# Patient Record
Sex: Female | Born: 1960 | Race: White | Hispanic: No | Smoking: Never smoker
Health system: Southern US, Community
[De-identification: ages and names within clinical notes are randomized; demographics above are authoritative.]

## PROBLEM LIST (undated history)

## (undated) DIAGNOSIS — G43909 Migraine, unspecified, not intractable, without status migrainosus: Secondary | ICD-10-CM

---

## 1997-12-05 ENCOUNTER — Other Ambulatory Visit: Admission: RE | Admit: 1997-12-05 | Discharge: 1997-12-05 | Payer: Self-pay | Admitting: Obstetrics and Gynecology

## 1998-08-06 ENCOUNTER — Ambulatory Visit: Admission: RE | Admit: 1998-08-06 | Discharge: 1998-08-06 | Payer: Self-pay | Admitting: Obstetrics and Gynecology

## 1998-09-25 ENCOUNTER — Other Ambulatory Visit: Admission: RE | Admit: 1998-09-25 | Discharge: 1998-09-25 | Payer: Self-pay | Admitting: Obstetrics and Gynecology

## 2000-03-21 ENCOUNTER — Other Ambulatory Visit: Admission: RE | Admit: 2000-03-21 | Discharge: 2000-03-21 | Payer: Self-pay | Admitting: Obstetrics and Gynecology

## 2001-03-23 ENCOUNTER — Other Ambulatory Visit: Admission: RE | Admit: 2001-03-23 | Discharge: 2001-03-23 | Payer: Self-pay | Admitting: Obstetrics and Gynecology

## 2002-05-14 ENCOUNTER — Other Ambulatory Visit: Admission: RE | Admit: 2002-05-14 | Discharge: 2002-05-14 | Payer: Self-pay | Admitting: Obstetrics and Gynecology

## 2002-05-15 ENCOUNTER — Other Ambulatory Visit: Admission: RE | Admit: 2002-05-15 | Discharge: 2002-05-15 | Payer: Self-pay | Admitting: Obstetrics and Gynecology

## 2003-06-10 ENCOUNTER — Other Ambulatory Visit: Admission: RE | Admit: 2003-06-10 | Discharge: 2003-06-10 | Payer: Self-pay | Admitting: Obstetrics and Gynecology

## 2004-10-01 ENCOUNTER — Other Ambulatory Visit: Admission: RE | Admit: 2004-10-01 | Discharge: 2004-10-01 | Payer: Self-pay | Admitting: Obstetrics and Gynecology

## 2008-04-29 ENCOUNTER — Emergency Department (HOSPITAL_BASED_OUTPATIENT_CLINIC_OR_DEPARTMENT_OTHER): Admission: EM | Admit: 2008-04-29 | Discharge: 2008-04-29 | Payer: Self-pay | Admitting: Emergency Medicine

## 2008-04-29 ENCOUNTER — Ambulatory Visit: Payer: Self-pay | Admitting: Diagnostic Radiology

## 2009-05-09 ENCOUNTER — Encounter: Admission: RE | Admit: 2009-05-09 | Discharge: 2009-05-09 | Payer: Self-pay | Admitting: Obstetrics and Gynecology

## 2009-05-13 ENCOUNTER — Encounter: Admission: RE | Admit: 2009-05-13 | Discharge: 2009-05-13 | Payer: Self-pay | Admitting: Obstetrics and Gynecology

## 2010-09-10 ENCOUNTER — Ambulatory Visit (HOSPITAL_COMMUNITY)
Admission: RE | Admit: 2010-09-10 | Discharge: 2010-09-10 | Disposition: A | Discharge status: Home / Self Care | Payer: PRIVATE HEALTH INSURANCE | Source: Ambulatory Visit | Attending: Obstetrics and Gynecology | Admitting: Obstetrics and Gynecology

## 2010-09-10 ENCOUNTER — Other Ambulatory Visit: Payer: Self-pay | Admitting: Obstetrics and Gynecology

## 2010-09-10 DIAGNOSIS — N840 Polyp of corpus uteri: Secondary | ICD-10-CM | POA: Insufficient documentation

## 2010-09-10 LAB — CBC
HCT: 38.1 % (ref 36.0–46.0)
MCH: 29.4 pg (ref 26.0–34.0)
MCHC: 33.9 g/dL (ref 30.0–36.0)
MCV: 86.8 fL (ref 78.0–100.0)
RBC: 4.39 MIL/uL (ref 3.87–5.11)
RDW: 12.5 % (ref 11.5–15.5)
WBC: 6.1 10*3/uL (ref 4.0–10.5)

## 2010-09-23 NOTE — Op Note (Signed)
  Ashley Johnson, Ashley NO.:  [id number]  MEDICAL RECORD NO.:  [id number]  LOCATION:  [facility]                          FACILITY:  [facility]  PHYSICIAN:  Malva Limes, M.D.    DATE OF BIRTH:  October 14, 1960  DATE OF PROCEDURE:  09/10/2010 DATE OF DISCHARGE:                              OPERATIVE REPORT   PREOPERATIVE DIAGNOSIS:  Endometrial polyp.  POSTOPERATIVE DIAGNOSIS:  Endometrial polyp.  PROCEDURE: 1. Hysteroscopy. 2. Dilation and curettage.  SURGEON:  Malva Limes, MD  ANESTHESIA:  General and local.  ANTIBIOTIC:  Ancef 1 gram.  DRAINS:  Red rubber catheter through the bladder.  COMPLICATIONS:  None.  SPECIMENS:  Endometrial curettings sent to pathology.  PROCEDURE:  The patient was taken to the operating room where general anesthetic was administered without difficulty.  She was then placed in dorsal lithotomy position.  She was prepped and draped in the usual fashion for this procedure.  Her bladder was drained with a red rubber catheter.  Sterile speculum placed in the vagina and 20 mL of 1% lidocaine was used for paracervical block.  A single-tooth tenaculum was applied to the anterior cervical lip.  The cervix was serially dilated to a 29-French.  The hysteroscope was advanced through the endocervical canal.  The patient had 2 endometrial polyps on the posterior aspect and one small polyp on the left anterior cornual area.  Both ostia were visualized.  The hysteroscope was removed.  Sharp curettage was then performed followed by repeat hysteroscopy.  On the repeat hysteroscopy, no evidence of polyps were found.  This concluded the procedure. The patient was taken to recovery room in stable condition.  Instrument and lap counts were correct x2.  The patient will be discharged home. She will be sent home with Vicodin to take p.r.n.  She will follow up in the office in 4 weeks.          ______________________________ Malva Limes,  M.D.     MA/MEDQ  D:  09/10/2010  T:  09/11/2010  Job:  [id number]  Electronically Signed by Malva Limes M.D. on 09/23/2010 08:49:20 PM

## 2011-10-14 ENCOUNTER — Other Ambulatory Visit: Payer: Self-pay | Admitting: Obstetrics and Gynecology

## 2012-06-27 ENCOUNTER — Other Ambulatory Visit: Payer: Self-pay | Admitting: Obstetrics and Gynecology

## 2013-07-02 ENCOUNTER — Other Ambulatory Visit: Payer: Self-pay | Admitting: Obstetrics and Gynecology

## 2013-07-22 ENCOUNTER — Emergency Department (HOSPITAL_COMMUNITY): Payer: PRIVATE HEALTH INSURANCE

## 2013-07-22 ENCOUNTER — Emergency Department (HOSPITAL_COMMUNITY)
Admission: EM | Admit: 2013-07-22 | Discharge: 2013-07-22 | Disposition: A | Discharge status: Home / Self Care | Payer: PRIVATE HEALTH INSURANCE | Attending: Emergency Medicine | Admitting: Emergency Medicine

## 2013-07-22 ENCOUNTER — Encounter (HOSPITAL_COMMUNITY): Payer: Self-pay | Admitting: Emergency Medicine

## 2013-07-22 DIAGNOSIS — R0781 Pleurodynia: Secondary | ICD-10-CM

## 2013-07-22 DIAGNOSIS — W19XXXA Unspecified fall, initial encounter: Secondary | ICD-10-CM

## 2013-07-22 DIAGNOSIS — G43909 Migraine, unspecified, not intractable, without status migrainosus: Secondary | ICD-10-CM | POA: Insufficient documentation

## 2013-07-22 DIAGNOSIS — W010XXA Fall on same level from slipping, tripping and stumbling without subsequent striking against object, initial encounter: Secondary | ICD-10-CM | POA: Insufficient documentation

## 2013-07-22 DIAGNOSIS — Y929 Unspecified place or not applicable: Secondary | ICD-10-CM | POA: Insufficient documentation

## 2013-07-22 DIAGNOSIS — S298XXA Other specified injuries of thorax, initial encounter: Secondary | ICD-10-CM | POA: Insufficient documentation

## 2013-07-22 DIAGNOSIS — Y9302 Activity, running: Secondary | ICD-10-CM | POA: Insufficient documentation

## 2013-07-22 HISTORY — DX: Migraine, unspecified, not intractable, without status migrainosus: G43.909

## 2013-07-22 NOTE — ED Provider Notes (Signed)
Medical screening examination/treatment/procedure(s) were performed by non-physician practitioner and as supervising physician I was immediately available for consultation/collaboration.   EKG Interpretation None        Dagmar HaitWilliam Garry Nicolini, MD 07/22/13 [phone]

## 2013-07-22 NOTE — ED Provider Notes (Signed)
CSN: [id number]     Arrival date & time 07/22/13  1959 History   None    This chart was scribed for non-physician practitioner, Coral CeoJessica London Tarnowski, PA-C working with Dagmar HaitWilliam Blair Walden, MD by Arlan OrganAshley Leger, ED Scribe. This patient was seen in room TR07C/TR07C and the patient's care was started at 9:07 PM.   Chief Complaint  Patient presents with  . Fall   The history is provided by the patient. No language interpreter was used.    HPI Comments: Ashley DoomKaren A Kinchen is a 53 y.o. female with a PMH of migraines who presents to the Emergency Department complaining of a fall that occurred 8 days ago. Pt states he was running and tripped over a dog landing on the R side of her back. No head trauma or LOC upon impact. She now c/o R sided rib pain described as "achy" and "sharp" that is progressively worsening. This pain is exacerbated with sneezing and movement. She has tried OTC Tylenol with mild temporary improvement. At this time she denies any fever, chills, SOB, chest pain, weakness, cough, difficulty breathing, or lightheadedness. Denies weakness, loss of bowel/bladder function or saddle anesthesia. She reports increased activity throughout the week and questions if her symptoms may be related to a pulled muscle. Pt currently prescribed Imitrex for intermittent Migraines. No recent travel or surgeries. No past history of blood clots. No estrogen supplementation. Otherwise she is healthy; no other pertinent past medical history. No other concerns this visit.   Past Medical History  Diagnosis Date  . Migraine    History reviewed. No pertinent past surgical history. No family history on file. History  Substance Use Topics  . Smoking status: Never Smoker   . Smokeless tobacco: Not on file  . Alcohol Use: No   OB History   Grav Para Term Preterm Abortions TAB SAB Ect Mult Living                 Review of Systems  Constitutional: Negative for fever and chills.  HENT: Negative for congestion.    Eyes: Negative for redness.  Respiratory: Negative for cough and shortness of breath.   Cardiovascular: Negative for chest pain.  Musculoskeletal: Negative for back pain.       R sided rib pain  Skin: Negative for rash.  Psychiatric/Behavioral: Negative for confusion.    Allergies  Review of patient's allergies indicates not on file.  Home Medications   Prior to Admission medications   Not on File   Triage Vitals: BP 121/75  Pulse 60  Temp(Src) 98.6 F (37 C) (Oral)  Resp 20  Ht 5\' 6"  (1.676 m)  Wt 140 lb (63.504 kg)  BMI 22.61 kg/m2  SpO2 98%   Filed Vitals:   07/22/13 2005 07/22/13 2125  BP: 121/75 113/74  Pulse: 60 71  Temp: 98.6 F (37 C)   TempSrc: Oral   Resp: 20 16  Height: 5\' 6"  (1.676 m)   Weight: 140 lb (63.504 kg)   SpO2: 98% 98%    Physical Exam  Nursing note and vitals reviewed. Constitutional: She is oriented to person, place, and time. She appears well-developed and well-nourished. No distress.  HENT:  Head: Normocephalic and atraumatic.  Right Ear: External ear normal.  Left Ear: External ear normal.  Mouth/Throat: Oropharynx is clear and moist.  Eyes: Conjunctivae are normal. Right eye exhibits no discharge. Left eye exhibits no discharge.  Neck: Normal range of motion. Neck supple.  No cervical spinal or paraspinal tenderness to  palpation throughout.  No limitations with neck ROM.    Cardiovascular: Normal rate, regular rhythm, normal heart sounds and intact distal pulses.  Exam reveals no gallop and no friction rub.   No murmur heard. Pulmonary/Chest: Effort normal and breath sounds normal. No respiratory distress. She has no wheezes. She has no rales. She exhibits tenderness.    Tenderness to palpation to the right inferior lateral ribs. No crepitus, ecchymosis, erythema, or lacerations. Pain worse with movement.   Abdominal: Soft. She exhibits no distension. There is no tenderness.  Musculoskeletal: Normal range of motion. She exhibits  no edema and no tenderness.  No tenderness to palpation to the thoracic or lumbar spinous processes throughout.  No tenderness to palpation to the paraspinal muscles throughout. Patient able to ambulate without difficulty or ataxia. Patient moving all extremities throughout exam. No LE edema or calf tenderness bilaterally  Neurological: She is alert and oriented to person, place, and time.  Skin: Skin is warm and dry. She is not diaphoretic.     ED Course  Procedures (including critical care time)  DIAGNOSTIC STUDIES: Oxygen Saturation is 98% on RA, Normal by my interpretation.    COORDINATION OF CARE: 9:15 PM- Will order DG ribs unilateral w/ chest R. Discussed treatment plan with pt at bedside and pt agreed to plan.     Labs Review Labs Reviewed - No data to display  Imaging Review Dg Ribs Unilateral W/chest Right  07/22/2013   CLINICAL DATA:  Right-sided rib pain following injury  EXAM: RIGHT RIBS AND CHEST - 3+ VIEW  COMPARISON:  None.  FINDINGS: No fracture or other bone lesions are seen involving the ribs. There is no evidence of pneumothorax or pleural effusion. Both lungs are clear. Heart size and mediastinal contours are within normal limits.  IMPRESSION: No acute abnormality noted.   Electronically Signed   By: Alcide CleverMark  Lukens M.D.   On: 07/22/2013 20:56     EKG Interpretation None      MDM   Ashley Johnson is a 53 y.o. female is a 53 y.o. female with a PMH of migraines who presents to the Emergency Department complaining of a fall that occurred 8 days ago. Etiology of pain likely due to a rib contusion. X-rays negative for fracture or malalignment. No warning signs or symptoms. No concern for cauda equina, epidural abscess, or other serious/life threatening cause of back pain. Lungs clear to auscultation. Doubt cardiopulmonary causes. Pain reproducible. No hypoxia, respiratory distress, or tachypnea. Encouraged patient to avoid strenuous activity and continue Advil. Return  precautions, discharge instructions, and follow-up was discussed with the patient before discharge.    New Prescriptions   No medications on file     Final impressions: 1. Fall   2. Rib pain on right side       Luiz IronJessica Katlin Demarkis Gheen PA-C   I personally performed the services described in this documentation, which was scribed in my presence. The recorded information has been reviewed and is accurate.    Jillyn LedgerJessica K Philip Kotlyar, PA-C 07/22/13 2128

## 2013-07-22 NOTE — ED Notes (Addendum)
Pt was tripped by a dog and fell on her back last Sunday.  C/o R sided rib pain since fall.  States she was also very active last Sunday and could have pulled a muscle.  Denies sob.  Pain worse today after sneezing.

## 2013-07-22 NOTE — Discharge Instructions (Signed)
Continue to take Advil for pain  Deep breathing  Return to the emergency department if you develop any changing/worsening condition, chest pain, difficulty breathing, coughing up blood, fever, or any other concerns (please read additional information regarding your condition below)      Chest Wall Pain Chest wall pain is pain in or around the bones and muscles of your chest. It may take up to 6 weeks to get better. It may take longer if you must stay physically active in your work and activities.  CAUSES  Chest wall pain may happen on its own. However, it may be caused by:  A viral illness like the flu.  Injury.  Coughing.  Exercise.  Arthritis.  Fibromyalgia.  Shingles. HOME CARE INSTRUCTIONS   Avoid overtiring physical activity. Try not to strain or perform activities that cause pain. This includes any activities using your chest or your abdominal and side muscles, especially if heavy weights are used.  Put ice on the sore area.  Put ice in a plastic bag.  Place a towel between your skin and the bag.  Leave the ice on for 15-20 minutes per hour while awake for the first 2 days.  Only take over-the-counter or prescription medicines for pain, discomfort, or fever as directed by your caregiver. SEEK IMMEDIATE MEDICAL CARE IF:   Your pain increases, or you are very uncomfortable.  You have a fever.  Your chest pain becomes worse.  You have new, unexplained symptoms.  You have nausea or vomiting.  You feel sweaty or lightheaded.  You have a cough with phlegm (sputum), or you cough up blood. MAKE SURE YOU:   Understand these instructions.  Will watch your condition.  Will get help right away if you are not doing well or get worse. Document Released: 03/01/2005 Document Revised: 05/24/2011 Document Reviewed: 10/26/2010 [facility] Patient Information 2014 [place], [place].   Rib Contusion A rib contusion (bruise) can occur by a blow to the chest or by a fall  against a hard object. Usually these will be much better in a couple weeks. If X-rays were taken today and there are no broken bones (fractures), the diagnosis of bruising is made. However, broken ribs may not show up for several days, or may be discovered later on a routine X-ray when signs of healing show up. If this happens to you, it does not mean that something was missed on the X-ray, but simply that it did not show up on the first X-rays. Earlier diagnosis will not usually change the treatment. HOME CARE INSTRUCTIONS   Avoid strenuous activity. Be careful during activities and avoid bumping the injured ribs. Activities that pull on the injured ribs and cause pain should be avoided, if possible.  For the first day or two, an ice pack used every 20 minutes while awake may be helpful. Put ice in a plastic bag and put a towel between the bag and the skin.  Eat a normal, well-balanced diet. Drink plenty of fluids to avoid constipation.  Take deep breaths several times a day to keep lungs free of infection. Try to cough several times a day. Splint the injured area with a pillow while coughing to ease pain. Coughing can help prevent pneumonia.  Wear a rib belt or binder only if told to do so by your caregiver. If you are wearing a rib belt or binder, you must do the breathing exercises as directed by your caregiver. If not used properly, rib belts or binders restrict breathing  which can lead to pneumonia.  Only take over-the-counter or prescription medicines for pain, discomfort, or fever as directed by your caregiver. SEEK MEDICAL CARE IF:   You or your child has an oral temperature above 102 F (38.9 C).  Your baby is older than 3 months with a rectal temperature of 100.5 F (38.1 C) or higher for more than 1 day.  You develop a cough, with thick or bloody sputum. SEEK IMMEDIATE MEDICAL CARE IF:   You have difficulty breathing.  You feel sick to your stomach (nausea), have vomiting or  belly (abdominal) pain.  You have worsening pain, not controlled with medications, or there is a change in the location of the pain.  You develop sweating or radiation of the pain into the arms, jaw or shoulders, or become light headed or faint.  You or your child has an oral temperature above 102 F (38.9 C), not controlled by medicine.  Your or your baby is older than 3 months with a rectal temperature of 102 F (38.9 C) or higher.  Your baby is 50 months old or younger with a rectal temperature of 100.4 F (38 C) or higher. MAKE SURE YOU:   Understand these instructions.  Will watch your condition.  Will get help right away if you are not doing well or get worse. Document Released: 11/24/2000 Document Revised: 06/26/2012 Document Reviewed: 10/18/2007 [facility] Patient Information 2014 [place], [place].   RICE: Routine Care for Injuries The routine care of many injuries includes Rest, Ice, Compression, and Elevation (RICE). HOME CARE INSTRUCTIONS Rest is needed to allow your body to heal. Routine activities can usually be resumed when comfortable. Injured tendons and bones can take up to 6 weeks to heal. Tendons are the cord-like structures that attach muscle to bone. Ice following an injury helps keep the swelling down and reduces pain. Put ice in a plastic bag. Place a towel between your skin and the bag. Leave the ice on for 15-20 minutes, 03-04 times a day. Do this while awake, for the first 24 to 48 hours. After that, continue as directed by your caregiver. Compression helps keep swelling down. It also gives support and helps with discomfort. If an elastic bandage has been applied, it should be removed and reapplied every 3 to 4 hours. It should not be applied tightly, but firmly enough to keep swelling down. Watch fingers or toes for swelling, bluish discoloration, coldness, numbness, or excessive pain. If any of these problems occur, remove the bandage and reapply loosely.  Contact your caregiver if these problems continue. Elevation helps reduce swelling and decreases pain. With extremities, such as the arms, hands, legs, and feet, the injured area should be placed near or above the level of the heart, if possible. SEEK IMMEDIATE MEDICAL CARE IF: You have persistent pain and swelling. You develop redness, numbness, or unexpected weakness. Your symptoms are getting worse rather than improving after several days. These symptoms may indicate that further evaluation or further X-rays are needed. Sometimes, X-rays may not show a small broken bone (fracture) until 1 week or 10 days later. Make a follow-up appointment with your caregiver. Ask when your X-ray results will be ready. Make sure you get your X-ray results. Document Released: 06/13/2000 Document Revised: 05/24/2011 Document Reviewed: 07/31/2010 [employer] Patient Information 2014 [place]. =   Emergency Department Resource Guide 1) Find a Doctor and Pay Out of Pocket Although you won't have to find out who is covered by your insurance plan, it is  a good idea to ask around and get recommendations. You will then need to call the office and see if the doctor you have chosen will accept you as a new patient and what types of options they offer for patients who are self-pay. Some doctors offer discounts or will set up payment plans for their patients who do not have insurance, but you will need to ask so you aren't surprised when you get to your appointment.  2) Contact Your Local Health Department Not all health departments have doctors that can see patients for sick visits, but many do, so it is worth a call to see if yours does. If you don't know where your local health department is, you can check in your phone book. The CDC also has a tool to help you locate your state's health department, and many state websites also have listings of all of their local health departments.  3) Find a Walk-in Clinic If your  illness is not likely to be very severe or complicated, you may want to try a walk in clinic. These are popping up all over the country in pharmacies, drugstores, and shopping centers. They're usually staffed by nurse practitioners or physician assistants that have been trained to treat common illnesses and complaints. They're usually fairly quick and inexpensive. However, if you have serious medical issues or chronic medical problems, these are probably not your best option.  No Primary Care Doctor: - Call Health Connect at  [phone] - they can help you locate a primary care doctor that  accepts your insurance, provides certain services, etc. - Physician Referral Service- [phone]  Chronic Pain Problems: Organization         Address  Phone   Notes  [facility]  [phone] Patients need to be referred by their primary care doctor.   Medication Assistance: Organization         Address  Phone   Notes  [facility] Medication So [facility] [address] [place], [place] [address] [phone] --Must be a resident of [facility] [place] -- Must have NO insurance coverage whatsoever (no Medicaid/ Medicare, etc.) -- The pt. MUST have a primary care doctor that directs their care regularly and follows them in the community   MedAssist  [phone]   [employer]  [phone]    Agencies that provide inexpensive medical care: Organization         Address  Phone   Notes  [facility]  [phone]   [facility]    [phone]   [facility] [address] [place], [place] [address] [phone]) [phone]   [facility] [address], [place] [phone]) [phone]   Planned Parenthood    [phone]   [facility]    [phone]   Community Health and [facility]  [address], [place] Phone:  [phone], Fax:  [phone] Hours of Operation:   9 am - 6 pm, M-F.  Also accepts Medicaid/Medicare and self-pay.  [facility]  [address], [place] Phone: [phone], Fax: [phone]) [phone]. Hours of Operation:  8:30 am - 5:30 pm, M-F.  Also accepts Medicaid and self-pay.  [facility] [place] [address], [place] Phone: [phone]) [phone]   [facility] [address], [place], [place] [phone], Ext. [phone] Mondays & Thursdays: 7-9 AM.  First 15 patients are seen on a first come, first serve basis.    Medicaid-accepting [facility] Providers:  Organization         Address  Phone   Notes  [facility] [address], [place] [phone] Also accepts self-pay patients.  [facility] [address] [place], [place]  [phone]   [facility] [address], [place] [phone]) [phone]   [facility] [address] [place] [address], [place] [phone]) [phone]   Renaye RakersVeita Bland [address], [place]   [phone]) [phone] Only accepts [place] Access [place] patients after they have their name applied to their card.   Self-Pay (no insurance) in [facility] [place]:  Organization         Address  Phone   Notes  Sickle Cell Patients, [facility] [address], [place] [phone]) [phone]   [facility] [address], [place] [phone]) [phone]   [facility] [place]  [address], [place] [phone]) [phone]   [facility]/Dr. Osei-Bonsu  [address] [place] [address], [place] or [phone], [place] [phone] Phone number for both [place] and [place] locations is the same.  [facility] [address], [place] [phone]) [phone]   [facility] [place] [address] [place] [address], [place] or [address] [phone]   [facility] [address], [place] [phone]) [phone], phone; [phone]) [phone], fax Sees patients 1st and 3rd Saturday of every month.  Must not qualify for public or private insurance (i.e. Medicaid, Medicare, [place] Health Choice, Veterans' Benefits)  Household income should be no more than 200% of the poverty level The clinic cannot treat you if you are pregnant or think you are pregnant  Sexually transmitted diseases are not treated at the clinic.    Dental Care: Organization         Address  Phone  Notes  [facility] of [facility] [address], [place] [phone] Accepts children up to age 51 who are enrolled in [place]icaid or [place] Health Choice; pregnant women with a Medicaid card; and children who have applied for Medicaid or [place] Health Choice, but were declined, whose parents can pay a reduced fee at time of service.  [facility] of [facility] [place]  [address], [place] [phone]) [phone] Accepts children up to age 51 who are enrolled in [place] or [place] Health Choice; pregnant women with a Medicaid card; and children who have applied for Medicaid or [place] Health Choice, but were declined, whose parents can pay a reduced fee at time of service.  [facility]  [address], [place] [phone] Patients are seen by appointment only. Walk-ins are not accepted. [facility] will see patients 51 years of age and older. Monday - Tuesday (8am-5pm) Most Wednesdays (8:30-5pm) $30 per visit, cash only  [facility]  [address], [facility] [phone] Patients are seen by appointment only. Walk-ins are not accepted. [facility] will see patients 51 years of age and older. One Wednesday Evening (Monthly: Volunteer Based).  $30 per visit, cash only  [facility]  [phone]) [phone] for adults; Children under age 814, call Graduate Pediatric  Dentistry at  [phone]. Children aged 11-14, please call [phone] to request a pediatric application.  Dental services are provided in all areas of dental care including fillings, crowns and bridges, complete and partial dentures, implants, gum treatment, root canals, and extractions. Preventive care is also provided. Treatment is provided to both adults and children. Patients are selected via a lottery and there is often a waiting list.   [facility] [address], [place]  [phone] [email or web address]   [facility] [address] [place], [place] [phone], Ext. [phone] Second and Fourth Thursday of each month, opens at 6:30 AM; Clinic ends at 9 AM.  Patients are seen on a first-come first-served basis, and a limited number are seen during each clinic.   [facility]  [address] [place], [place] [phone]   Eligibility Requirements You must have lived in [place], [place], or [place] counties for at least the last three months.   You cannot be eligible for state or federal sponsored [place], including [employer], [place], or [employer].   You generally cannot be eligible for healthcare insurance through your employer.    How to apply: Eligibility screenings are held every Tuesday and Wednesday afternoon from 1:00 pm until 4:00 pm. You do not need an appointment for the interview!  [facility] [address], [place], [place] [phone]   [facility] Health Department  [phone]   [facility] Health Department  [phone]   [facility] Health Department  [phone]    Behavioral Health Resources in the Community: Intensive Outpatient Programs Organization         Address  Phone  Notes  [facility] [address] [place], [place] [phone]   [facility] [address] [place], [place] [phone]   ADS: Alcohol &  Drug Svcs [address], [place], [place]  [phone]   [facility] Mental Health [address],  [place], [place] [phone] or [phone]   Substance Abuse Resources Organization         Address  Phone  Notes  Alcohol [facility] Drug Services  [phone]   [facility]  [phone]   [facility]  [phone]   Floydene Flock  [phone]   Residential & Outpatient Substance Abuse Program  [phone]   Psychological Services Organization         Address  Phone  Notes  [facility] Behavioral Health  [phone]   [facility] Services  [phone]   [facility] Mental Health [address] [place] [phone] or [phone]    Mobile Crisis Teams Organization         Address  Phone  Notes  [facility], Mobile Crisis Care Unit  [phone]   [facility]  [address] [place], [place] [phone]   Doristine Locks [address] [place] [phone]    Self-Help/Support Groups Organization         Address  Phone             Notes  [facility] [place] - variety of support groups  [phone]- I7437963 Call for more information  Narcotics Anonymous (NA), [facility]aring [facility] [address], [employer] [place]  2 meetings at this location   Statistician         Address  Phone  Notes  [facility] [address],  [place]  [phone]   [facility]  [address], [place] [address], [place], [place] [phone]   [facility] [address] [place], [place] [phone] Admissions: 8am-3pm M-F  [facility] [address]    [place], [place] [phone]   [facility] [address] [place], [place], [place] [phone]   [facility] [address]  [place], [place] [phone]   [facility] - Intensive Outpatient [address], [place], [place]  [phone]   [facility] ([facility] [address]  [place], [place] [phone] or [phone]   [facility] ([facility]) [address] [place], [place] [phone] Accepts Medicaid  [facility] [address],  [place] [phone] Substance Abuse/Addiction Treatment   [facility] Organization         Address  Phone  Notes  [facility]  [phone]   Angie Fava, PhD [address] [place] [place], [place]   [phone] or [phone]   [facility]al   [address] [place], [place] [phone]   [facility] [address] [place], [place] [phone] Insurance/Medicaid/sponsorship through [facility] and Families [address]                                    [place], [place] [phone] Therapy/tele-psych/case  [facility] [address], [place] [phone]    Dr. Lolly Mustache  [phone]   [facility] [place]  [employer] [facility] Dept. 1) [address] [place] 2) [address], [place] 3)  [address], [place] [phone]  [phone]   [facility] Child Abuse Hotline 3040263928 or [phone] (After Hours)

## 2013-07-22 NOTE — ED Notes (Signed)
Onset last Sunday pt tripped over dog and fell on back.  Pain was ok during week.  Pt did normal activities during the week: mowing, walking around natural science center, working; then, Friday night pain on right side of rib started hurting and progressively getting worse.  No difficulties breathing.

## 2014-07-08 ENCOUNTER — Other Ambulatory Visit: Payer: Self-pay | Admitting: Obstetrics and Gynecology

## 2014-07-09 LAB — CYTOLOGY - PAP

## 2014-08-22 ENCOUNTER — Other Ambulatory Visit: Payer: Self-pay | Admitting: Obstetrics and Gynecology

## 2014-08-22 DIAGNOSIS — R928 Other abnormal and inconclusive findings on diagnostic imaging of breast: Secondary | ICD-10-CM

## 2014-08-30 ENCOUNTER — Ambulatory Visit
Admission: RE | Admit: 2014-08-30 | Discharge: 2014-08-30 | Disposition: A | Discharge status: Home / Self Care | Payer: PRIVATE HEALTH INSURANCE | Source: Ambulatory Visit | Attending: Obstetrics and Gynecology | Admitting: Obstetrics and Gynecology

## 2014-08-30 DIAGNOSIS — R928 Other abnormal and inconclusive findings on diagnostic imaging of breast: Secondary | ICD-10-CM

## 2016-01-14 ENCOUNTER — Other Ambulatory Visit: Payer: Self-pay | Admitting: Obstetrics and Gynecology

## 2016-01-14 DIAGNOSIS — N6001 Solitary cyst of right breast: Secondary | ICD-10-CM

## 2016-01-14 DIAGNOSIS — R928 Other abnormal and inconclusive findings on diagnostic imaging of breast: Secondary | ICD-10-CM

## 2016-01-15 ENCOUNTER — Other Ambulatory Visit: Payer: Self-pay | Admitting: Obstetrics and Gynecology

## 2016-01-16 LAB — CYTOLOGY - PAP

## 2016-01-19 ENCOUNTER — Ambulatory Visit
Admission: RE | Admit: 2016-01-19 | Discharge: 2016-01-19 | Disposition: A | Discharge status: Home / Self Care | Payer: PRIVATE HEALTH INSURANCE | Source: Ambulatory Visit | Attending: Obstetrics and Gynecology | Admitting: Obstetrics and Gynecology

## 2016-01-19 DIAGNOSIS — N6001 Solitary cyst of right breast: Secondary | ICD-10-CM

## 2016-01-19 DIAGNOSIS — R928 Other abnormal and inconclusive findings on diagnostic imaging of breast: Secondary | ICD-10-CM

## 2016-06-25 IMAGING — MG MM DIAG BREAST TOMO BILATERAL
8 series · 8 of 24 positions shown · non-contrast
Comparison: Previous examinations, including the screening
mammogram dated 08/16/2014 at [facility].

CLINICAL DATA: Possible mass in the medial right breast posteriorly
and possible distortion in the upper outer left breast anteriorly on
a recent screening mammogram. Previous left breast cyst aspiration.

EXAM:
DIGITAL DIAGNOSTIC BILATERAL MAMMOGRAM WITH 3D TOMOSYNTHESIS WITH
CAD
ULTRASOUND RIGHT BREAST

[L MLO]
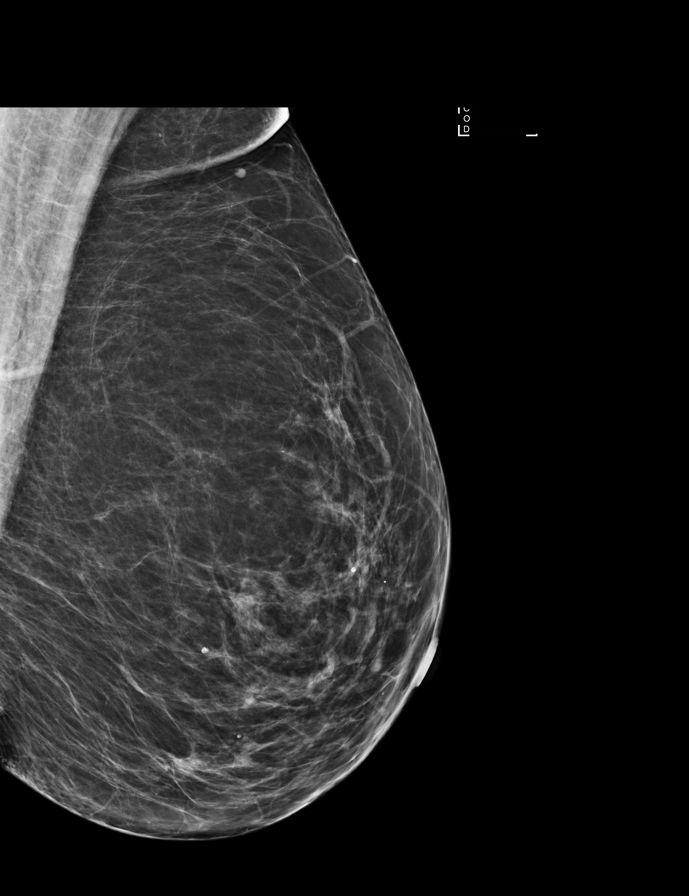

[R MLO]
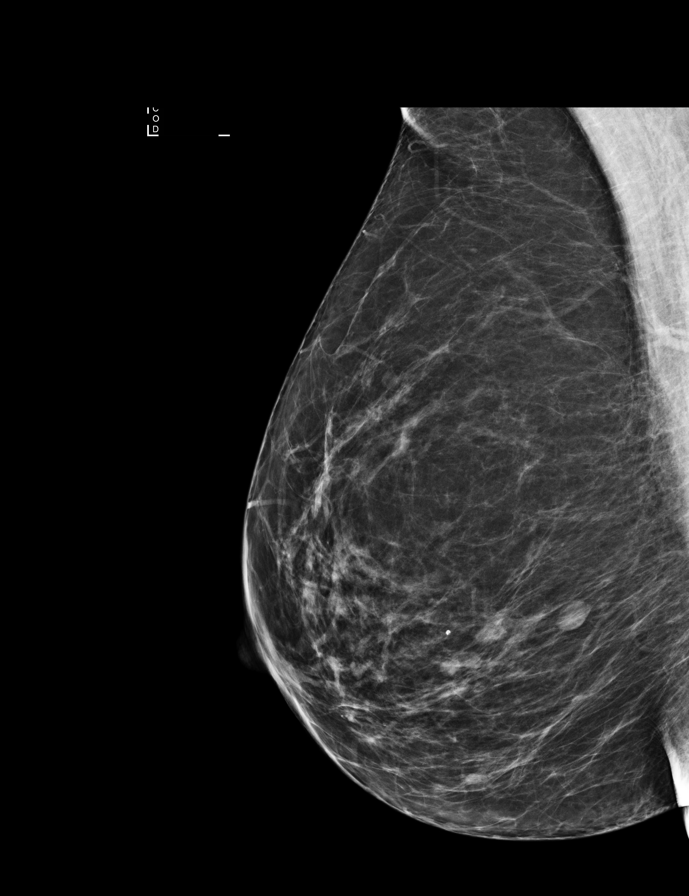

[L CC]
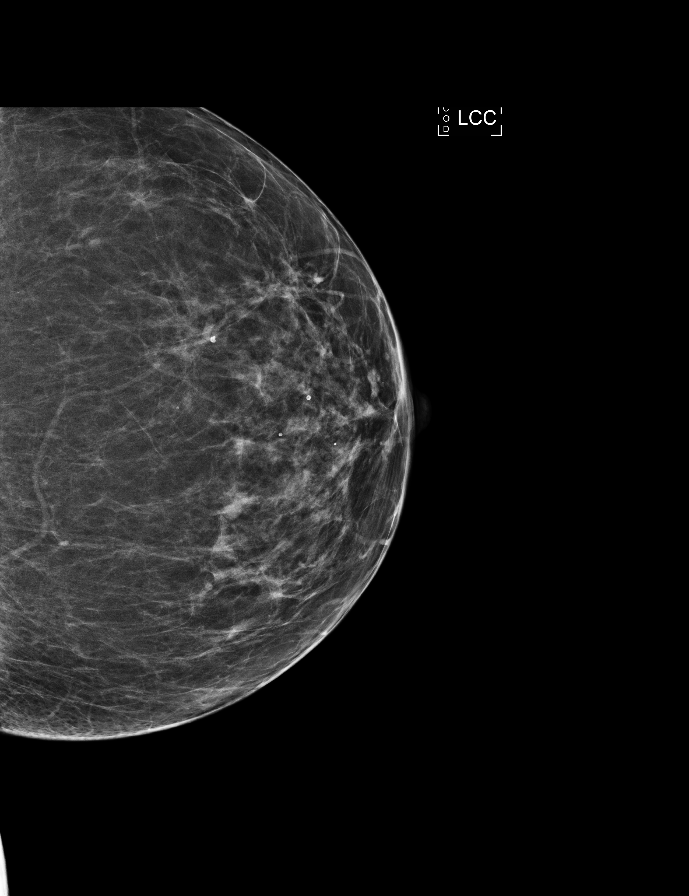

[R CC]
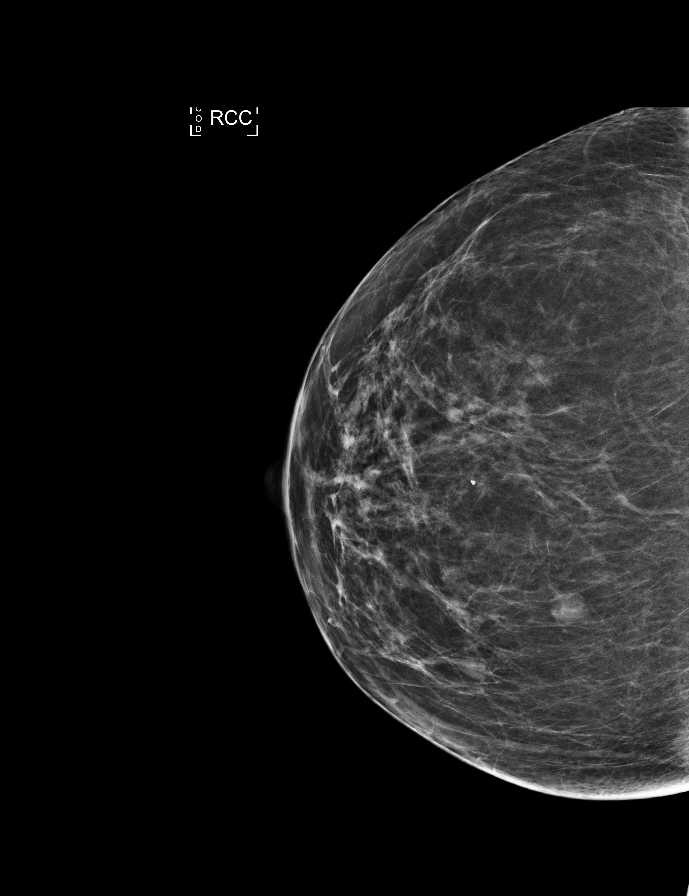

[L MLO tomo · tomo slice 37/72.0]
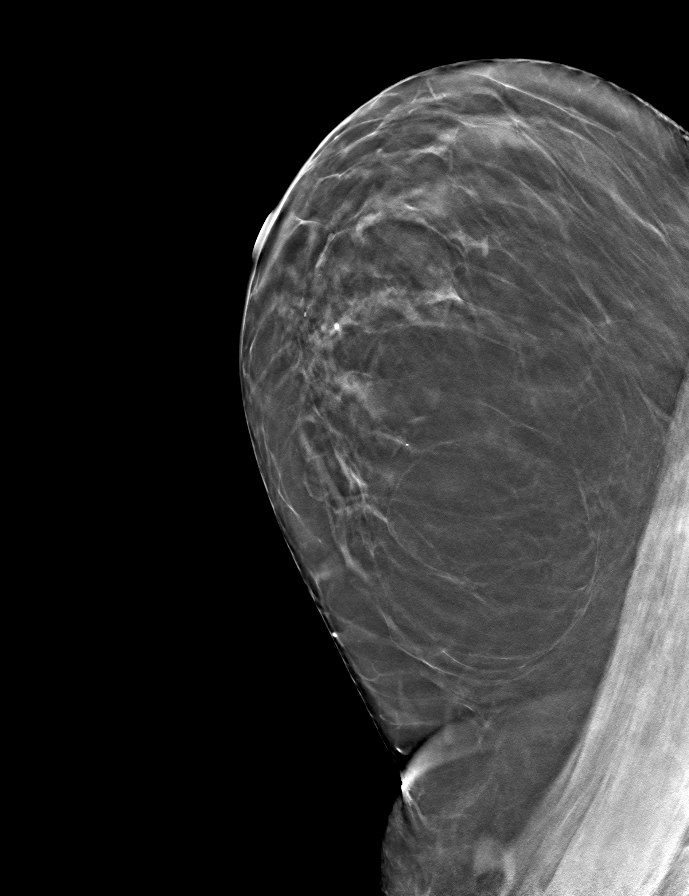

[R MLO tomo · tomo slice 35/69.0]
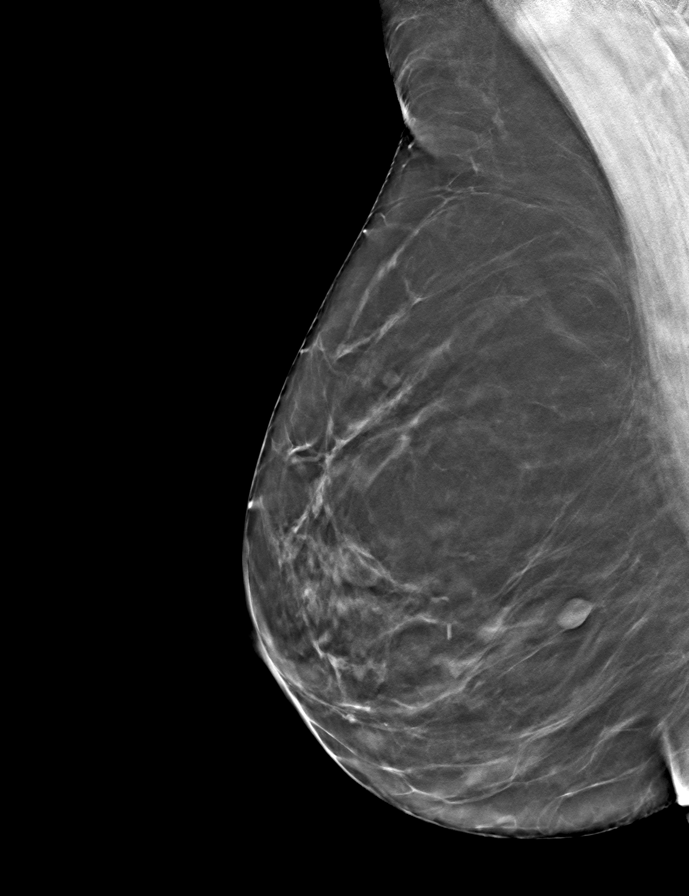

[R CC tomo · tomo slice 35/68.0]
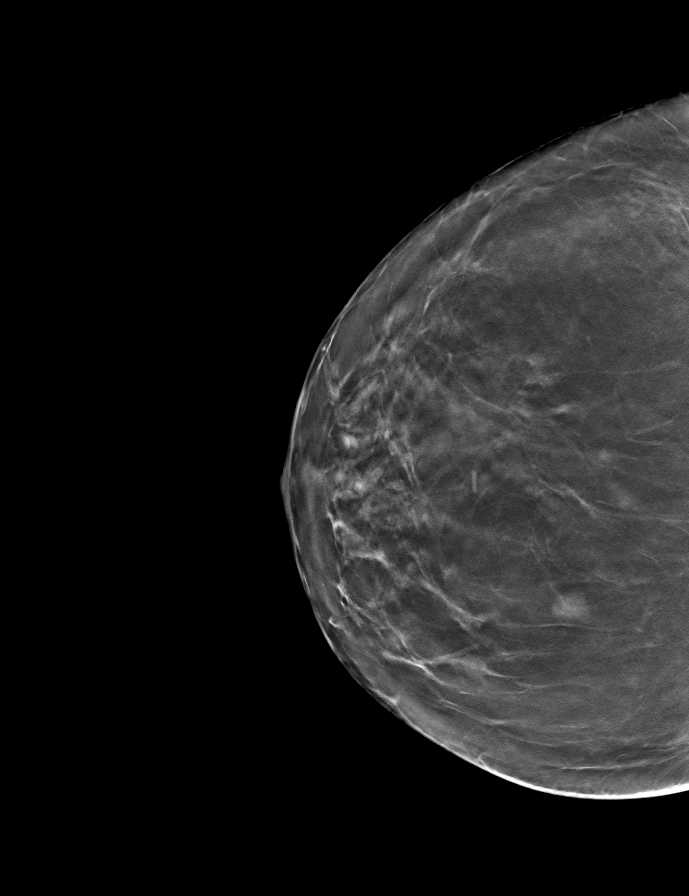

[L CC tomo · tomo slice 35/69.0]
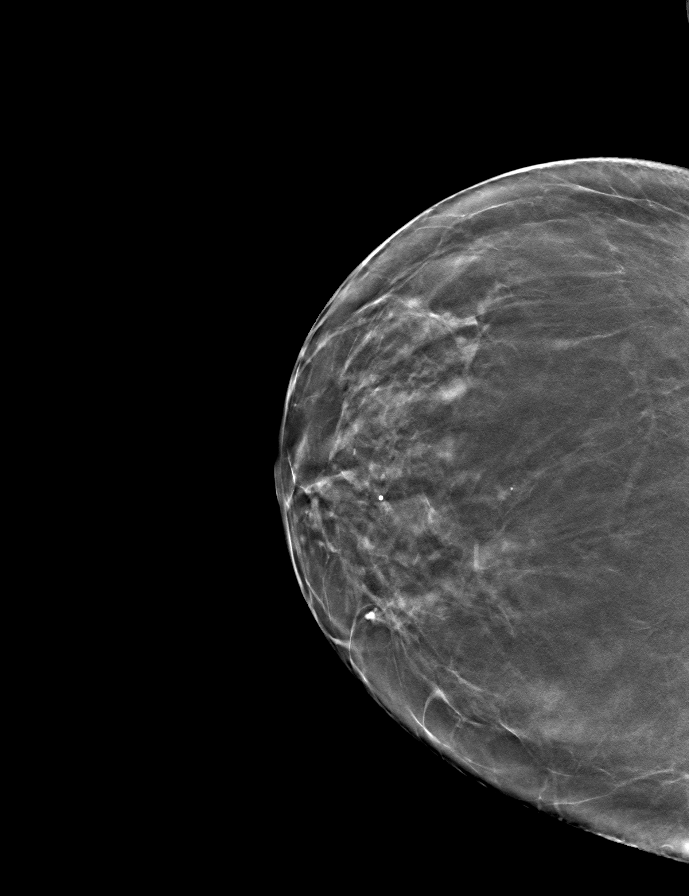

[8 of 24 positions shown; findings below may reference images not displayed]

ACR Breast Density Category b: There are scattered areas of
fibroglandular density.
FINDINGS: 3D tomographic images of the right breast confirm a rounded,
circumscribed mass in the posterior aspect of the upper inner
quadrant of the right breast.

3D tomographic images of the left breast demonstrate normal
appearing breast tissue in the upper-outer quadrant of the breast at
the location of the recently suspected distortion. There are
crossing linear vessels and ligaments in this area.

Mammographic images were processed with CAD.

On physical exam, no mass is palpable in the medial right breast.

Targeted ultrasound is performed, showing a 9 x 7 x 6 mm cyst
containing a thin internal septation and a small amount of dependent
debris an calcification in the 2 o'clock position of the right
breast, 4 cm from the nipple. No internal blood flow was seen with
power Doppler. This corresponds to the mammographic mass.
IMPRESSION: 1. 9 mm mildly complex cyst in the 2 o'clock position of the right
breast.
2. The recently suspected distortion in the upper outer quadrant of
the left breast represented crossing of normal ligaments and
vessels.

RECOMMENDATION:
Right breast ultrasound in 6 months. The option of ultrasound-guided
cyst aspiration/core needle biopsy was discussed with the patient
but not recommended at this time. She is currently comfortable with
the 6 month followup ultrasound.

I have discussed the findings and recommendations with the patient.
Results were also provided in writing at the conclusion of the
visit. If applicable, a reminder letter will be sent to the patient
regarding the next appointment.

BI-RADS CATEGORY  3: Probably benign.

## 2019-03-08 ENCOUNTER — Ambulatory Visit: Payer: Self-pay | Admitting: *Deleted

## 2019-03-08 NOTE — Telephone Encounter (Signed)
Per initial encounter, "Pt is calling and her daughter in law came in contact with someone who tested positive for covid 19 and she pick up some mcdonald for her mother in law the patient on Tuesday the mother in law came to the care to get the food. Pt would like to know if she should be tested"; the pt says that she called urgent care, and was told to wait 5 days for testing; symptoms reviewed; pt advised to monitor for symptoms, and take temp twice daily to look at upward trends; she verbalized understanding, and will monitor for symptoms; she will also be tested per advice of urgent care.    Reason for Disposition . General information question, no triage required and triager able to answer question  Answer Assessment - Initial Assessment Questions 1. REASON FOR CALL or QUESTION: "What is your reason for calling today?" or "How can I best help you?" or "What question do you have that I can help answer?"     Was I exposed to COVID  Protocols used: Aneta
